# Patient Record
Sex: Male | Born: 1980 | Race: Black or African American | Hispanic: No | Marital: Single | Smoking: Former smoker
Health system: Southern US, Community
[De-identification: ages and names within clinical notes are randomized; demographics above are authoritative.]

## PROBLEM LIST (undated history)

## (undated) DIAGNOSIS — W3400XA Accidental discharge from unspecified firearms or gun, initial encounter: Secondary | ICD-10-CM

## (undated) DIAGNOSIS — C3490 Malignant neoplasm of unspecified part of unspecified bronchus or lung: Secondary | ICD-10-CM

## (undated) HISTORY — PX: ABDOMINAL SURGERY: SHX537

---

## 2019-04-11 ENCOUNTER — Other Ambulatory Visit: Payer: Self-pay

## 2019-04-11 ENCOUNTER — Encounter: Payer: Self-pay | Admitting: Emergency Medicine

## 2019-04-11 ENCOUNTER — Emergency Department
Admission: EM | Admit: 2019-04-11 | Discharge: 2019-04-11 | Disposition: A | Discharge status: Home / Self Care | Payer: Medicaid Other | Attending: Emergency Medicine | Admitting: Emergency Medicine

## 2019-04-11 DIAGNOSIS — Z202 Contact with and (suspected) exposure to infections with a predominantly sexual mode of transmission: Secondary | ICD-10-CM | POA: Insufficient documentation

## 2019-04-11 DIAGNOSIS — Z87891 Personal history of nicotine dependence: Secondary | ICD-10-CM | POA: Insufficient documentation

## 2019-04-11 HISTORY — DX: Accidental discharge from unspecified firearms or gun, initial encounter: W34.00XA

## 2019-04-11 LAB — CHLAMYDIA/NGC RT PCR (ARMC ONLY)
Chlamydia Tr: NOT DETECTED
N gonorrhoeae: NOT DETECTED

## 2019-04-11 MED ORDER — LIDOCAINE HCL (PF) 1 % IJ SOLN
5.0000 mL | Freq: Once | INTRAMUSCULAR | Status: AC
  Administered 2019-04-11: 5 mL via INTRADERMAL
  Filled 2019-04-11: qty 5

## 2019-04-11 MED ORDER — AZITHROMYCIN 500 MG PO TABS
1000.0000 mg | ORAL_TABLET | Freq: Once | ORAL | Status: AC
  Administered 2019-04-11: 1000 mg via ORAL
  Filled 2019-04-11: qty 2

## 2019-04-11 MED ORDER — METRONIDAZOLE 500 MG PO TABS
2000.0000 mg | ORAL_TABLET | Freq: Once | ORAL | Status: AC
  Administered 2019-04-11: 2000 mg via ORAL
  Filled 2019-04-11: qty 4

## 2019-04-11 MED ORDER — CEFTRIAXONE SODIUM 250 MG IJ SOLR
250.0000 mg | Freq: Once | INTRAMUSCULAR | Status: AC
  Administered 2019-04-11: 250 mg via INTRAMUSCULAR
  Filled 2019-04-11: qty 250

## 2019-04-11 MED ORDER — PENICILLIN G BENZATHINE 1200000 UNIT/2ML IM SUSP
2.4000 10*6.[IU] | Freq: Once | INTRAMUSCULAR | Status: AC
  Administered 2019-04-11: 10:00:00 2.4 10*6.[IU] via INTRAMUSCULAR
  Filled 2019-04-11: qty 4

## 2019-04-11 NOTE — Discharge Instructions (Addendum)
Follow-up at the [facility].  You have been treated empirically today for gonorrhea/chlamydia/trichomoniasis/syphilis.  If your syphilis test returns is positive you will need to have further evaluation and should follow-up with [facility].
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
## 2019-04-11 NOTE — ED Triage Notes (Signed)
Pt to ED via POV, pt states that his girlfriend was diagnosed with Syphilis, gonorrhea, and chlamydia recently and told him that he needed to come in and be treated. Pt is in NAD. Pt denies any symptoms currently.

## 2019-04-11 NOTE — ED Notes (Signed)
Patient reports he is here for gonorrhea, chlamydia, and syphilis treatment

## 2019-04-13 LAB — RPR: RPR Ser Ql: NONREACTIVE

## 2019-10-19 ENCOUNTER — Encounter: Payer: Self-pay | Admitting: Emergency Medicine

## 2019-10-19 ENCOUNTER — Other Ambulatory Visit: Payer: Self-pay

## 2019-10-19 DIAGNOSIS — Z5321 Procedure and treatment not carried out due to patient leaving prior to being seen by health care provider: Secondary | ICD-10-CM | POA: Insufficient documentation

## 2019-10-19 DIAGNOSIS — M549 Dorsalgia, unspecified: Secondary | ICD-10-CM | POA: Insufficient documentation
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
## 2019-10-20 ENCOUNTER — Emergency Department
Admission: EM | Admit: 2019-10-20 | Discharge: 2019-10-20 | Disposition: A | Discharge status: Home / Self Care | Payer: Medicaid Other | Attending: Emergency Medicine | Admitting: Emergency Medicine

## 2019-10-20 NOTE — ED Notes (Signed)
No answer when called several times from lobby 

## 2021-03-25 ENCOUNTER — Other Ambulatory Visit: Payer: Self-pay

## 2021-03-25 ENCOUNTER — Emergency Department
Admission: EM | Admit: 2021-03-25 | Discharge: 2021-03-25 | Disposition: A | Discharge status: Home / Self Care | Payer: Medicaid Other | Attending: Physician Assistant | Admitting: Physician Assistant

## 2021-03-25 ENCOUNTER — Emergency Department: Payer: Medicaid Other

## 2021-03-25 ENCOUNTER — Encounter: Payer: Self-pay | Admitting: Emergency Medicine

## 2021-03-25 DIAGNOSIS — R531 Weakness: Secondary | ICD-10-CM | POA: Diagnosis present

## 2021-03-25 DIAGNOSIS — Z85118 Personal history of other malignant neoplasm of bronchus and lung: Secondary | ICD-10-CM | POA: Diagnosis not present

## 2021-03-25 DIAGNOSIS — Z87891 Personal history of nicotine dependence: Secondary | ICD-10-CM | POA: Diagnosis not present

## 2021-03-25 DIAGNOSIS — G609 Hereditary and idiopathic neuropathy, unspecified: Secondary | ICD-10-CM | POA: Insufficient documentation

## 2021-03-25 DIAGNOSIS — G629 Polyneuropathy, unspecified: Secondary | ICD-10-CM

## 2021-03-25 DIAGNOSIS — R400 Somnolence: Secondary | ICD-10-CM | POA: Diagnosis not present

## 2021-03-25 HISTORY — DX: Malignant neoplasm of unspecified part of unspecified bronchus or lung: C34.90

## 2021-03-25 LAB — CBC WITH DIFFERENTIAL/PLATELET
Abs Immature Granulocytes: 0.02 10*3/uL (ref 0.00–0.07)
Basophils Absolute: 0.1 10*3/uL (ref 0.0–0.1)
Basophils Relative: 1 %
Eosinophils Absolute: 0.4 10*3/uL (ref 0.0–0.5)
Eosinophils Relative: 4 %
HCT: 39.7 % (ref 39.0–52.0)
Hemoglobin: 13.6 g/dL (ref 13.0–17.0)
Immature Granulocytes: 0 %
Lymphocytes Relative: 19 %
Lymphs Abs: 1.7 10*3/uL (ref 0.7–4.0)
MCH: 31.9 pg (ref 26.0–34.0)
MCHC: 34.3 g/dL (ref 30.0–36.0)
MCV: 93.2 fL (ref 80.0–100.0)
Monocytes Absolute: 0.6 10*3/uL (ref 0.1–1.0)
Monocytes Relative: 7 %
Neutro Abs: 5.9 10*3/uL (ref 1.7–7.7)
Neutrophils Relative %: 69 %
Platelets: 452 10*3/uL — ABNORMAL HIGH (ref 150–400)
RBC: 4.26 MIL/uL (ref 4.22–5.81)
RDW: 13.2 % (ref 11.5–15.5)
WBC: 8.6 10*3/uL (ref 4.0–10.5)
nRBC: 0 % (ref 0.0–0.2)

## 2021-03-25 LAB — URINALYSIS, COMPLETE (UACMP) WITH MICROSCOPIC
Bacteria, UA: NONE SEEN
Bilirubin Urine: NEGATIVE
Glucose, UA: NEGATIVE mg/dL
Hgb urine dipstick: NEGATIVE
Ketones, ur: NEGATIVE mg/dL
Leukocytes,Ua: NEGATIVE
Nitrite: NEGATIVE
Protein, ur: NEGATIVE mg/dL
Specific Gravity, Urine: 1.021 (ref 1.005–1.030)
Squamous Epithelial / HPF: NONE SEEN (ref 0–5)
pH: 6 (ref 5.0–8.0)

## 2021-03-25 LAB — BASIC METABOLIC PANEL
Anion gap: 9 (ref 5–15)
BUN: 12 mg/dL (ref 6–20)
CO2: 23 mmol/L (ref 22–32)
Calcium: 9.2 mg/dL (ref 8.9–10.3)
Chloride: 107 mmol/L (ref 98–111)
Creatinine, Ser: 0.99 mg/dL (ref 0.61–1.24)
GFR, Estimated: >60 mL/min (ref >60)
Glucose, Bld: 104 mg/dL — ABNORMAL HIGH (ref 70–99)
Potassium: 3.9 mmol/L (ref 3.5–5.1)
Sodium: 139 mmol/L (ref 135–145)

## 2021-03-25 LAB — URINE DRUG SCREEN, QUALITATIVE (ARMC ONLY)
Amphetamines, Ur Screen: NOT DETECTED
Barbiturates, Ur Screen: NOT DETECTED
Benzodiazepine, Ur Scrn: NOT DETECTED
Cannabinoid 50 Ng, Ur ~~LOC~~: NOT DETECTED
Cocaine Metabolite,Ur ~~LOC~~: POSITIVE — AB
MDMA (Ecstasy)Ur Screen: NOT DETECTED
Methadone Scn, Ur: NOT DETECTED
Opiate, Ur Screen: NOT DETECTED
Phencyclidine (PCP) Ur S: NOT DETECTED
Tricyclic, Ur Screen: NOT DETECTED

## 2021-03-25 NOTE — ED Notes (Signed)
Pt given snacks and juice ok per PA

## 2021-03-25 NOTE — ED Notes (Signed)
Pt remains unable to pee. Eating and drinking normally.

## 2021-03-25 NOTE — ED Notes (Signed)
Taken to ct  

## 2021-03-25 NOTE — Discharge Instructions (Addendum)
No acute findings for your right-sided weakness on CT scanning today.  Due to your underlying medical condition advised follow-up with neurology for definitive evaluation.  Call the neurologist listed on your discharge care instructions Monday morning and tell them you are a follow-up from the emergency room.
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
## 2021-04-12 ENCOUNTER — Ambulatory Visit (HOSPITAL_BASED_OUTPATIENT_CLINIC_OR_DEPARTMENT_OTHER)
Admission: RE | Admit: 2021-04-12 | Discharge: 2021-04-12 | Disposition: A | Discharge status: Home / Self Care | Payer: Medicaid Other | Source: Ambulatory Visit | Attending: Family Medicine | Admitting: Family Medicine

## 2021-04-12 ENCOUNTER — Ambulatory Visit (INDEPENDENT_AMBULATORY_CARE_PROVIDER_SITE_OTHER): Payer: Medicaid Other | Admitting: Family Medicine

## 2021-04-12 ENCOUNTER — Ambulatory Visit: Payer: Self-pay

## 2021-04-12 ENCOUNTER — Other Ambulatory Visit: Payer: Self-pay

## 2021-04-12 VITALS — BP 120/82 | Ht 72.0 in | Wt 215.0 lb

## 2021-04-12 DIAGNOSIS — M25421 Effusion, right elbow: Secondary | ICD-10-CM | POA: Insufficient documentation

## 2021-04-12 DIAGNOSIS — M7551 Bursitis of right shoulder: Secondary | ICD-10-CM

## 2021-04-12 DIAGNOSIS — R270 Ataxia, unspecified: Secondary | ICD-10-CM | POA: Diagnosis not present

## 2021-04-12 DIAGNOSIS — M25511 Pain in right shoulder: Secondary | ICD-10-CM
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
## 2021-04-12 NOTE — Patient Instructions (Signed)
Nice to meet you I will call with the results from today.   Please send me a message in MyChart with any questions or updates.  We will have follow up based on the results.   --Dr. Raeford Razor
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
## 2021-04-14 ENCOUNTER — Telehealth: Payer: Self-pay | Admitting: Family Medicine

## 2021-04-14 NOTE — Telephone Encounter (Signed)
Unable to leave VM for patient. If he calls back please have him speak with a nurse/CMA and inform that his x-ray is normal.  Would consider getting updated MRI of his brain or having follow-up with neurologist given his new onset symptoms over the past month..   If any questions then please take the best time and phone number to call and I will try to call him back.   Rosemarie Ax, MD [facility] 04/14/2021, 10:44 AM

## 2021-07-19 ENCOUNTER — Ambulatory Visit: Payer: Medicaid Other | Admitting: Occupational Therapy

## 2021-07-26 ENCOUNTER — Ambulatory Visit: Payer: Medicaid Other | Attending: Nurse Practitioner | Admitting: Occupational Therapy

## 2021-08-02 ENCOUNTER — Ambulatory Visit: Payer: Medicaid Other | Admitting: Physical Therapy

## 2021-08-14 ENCOUNTER — Ambulatory Visit: Payer: Medicaid Other

## 2021-08-14 ENCOUNTER — Encounter: Payer: Medicaid Other | Admitting: Occupational Therapy

## 2021-08-16 ENCOUNTER — Encounter: Payer: Medicaid Other | Admitting: Occupational Therapy

## 2021-08-21 ENCOUNTER — Encounter: Payer: Medicaid Other | Admitting: Occupational Therapy

## 2021-08-21 ENCOUNTER — Ambulatory Visit: Payer: Medicaid Other

## 2021-08-23 ENCOUNTER — Ambulatory Visit: Payer: Medicaid Other | Admitting: Physical Therapy

## 2021-08-23 ENCOUNTER — Encounter: Payer: Medicaid Other | Admitting: Occupational Therapy

## 2021-08-25 ENCOUNTER — Emergency Department
Admission: EM | Admit: 2021-08-25 | Discharge: 2021-09-02 | Disposition: E | Payer: Medicaid Other | Attending: Emergency Medicine | Admitting: Emergency Medicine

## 2021-08-25 ENCOUNTER — Emergency Department: Payer: Medicaid Other

## 2021-08-25 DIAGNOSIS — C799 Secondary malignant neoplasm of unspecified site: Secondary | ICD-10-CM

## 2021-08-25 DIAGNOSIS — Z87891 Personal history of nicotine dependence: Secondary | ICD-10-CM | POA: Diagnosis not present

## 2021-08-25 DIAGNOSIS — Z85118 Personal history of other malignant neoplasm of bronchus and lung: Secondary | ICD-10-CM | POA: Insufficient documentation

## 2021-08-25 DIAGNOSIS — I2609 Other pulmonary embolism with acute cor pulmonale: Secondary | ICD-10-CM | POA: Diagnosis not present

## 2021-08-25 DIAGNOSIS — C7931 Secondary malignant neoplasm of brain: Secondary | ICD-10-CM | POA: Insufficient documentation

## 2021-08-25 DIAGNOSIS — I469 Cardiac arrest, cause unspecified: Secondary | ICD-10-CM | POA: Diagnosis present

## 2021-08-25 LAB — COMPREHENSIVE METABOLIC PANEL
ALT: 119 U/L — ABNORMAL HIGH (ref 0–44)
AST: 73 U/L — ABNORMAL HIGH (ref 15–41)
Albumin: 2.5 g/dL — ABNORMAL LOW (ref 3.5–5.0)
Alkaline Phosphatase: 73 U/L (ref 38–126)
Anion gap: 18 — ABNORMAL HIGH (ref 5–15)
BUN: UNDETERMINED mg/dL (ref 6–20)
CO2: 15 mmol/L — ABNORMAL LOW (ref 22–32)
Calcium: 8.4 mg/dL — ABNORMAL LOW (ref 8.9–10.3)
Chloride: 106 mmol/L (ref 98–111)
Creatinine, Ser: UNDETERMINED mg/dL (ref 0.61–1.24)
Glucose, Bld: 273 mg/dL — ABNORMAL HIGH (ref 70–99)
Potassium: 5.1 mmol/L (ref 3.5–5.1)
Sodium: 139 mmol/L (ref 135–145)
Total Bilirubin: 0.8 mg/dL (ref 0.3–1.2)
Total Protein: 5.3 g/dL — ABNORMAL LOW (ref 6.5–8.1)

## 2021-08-25 LAB — BLOOD GAS, ARTERIAL
Acid-base deficit: 22.2 mmol/L — ABNORMAL HIGH (ref 0.0–2.0)
Bicarbonate: 10.9 mmol/L — ABNORMAL LOW (ref 20.0–28.0)
FIO2: 100
MECHVT: 500 mL
Mechanical Rate: 18
O2 Saturation: 90.7 %
PEEP: 5 cmH2O
Patient temperature: 37
pCO2 arterial: 57 mmHg — ABNORMAL HIGH (ref 32.0–48.0)
pH, Arterial: <6.9 — CL (ref 7.350–7.450)
pO2, Arterial: 100 mmHg (ref 83.0–108.0)

## 2021-08-25 LAB — CBC WITH DIFFERENTIAL/PLATELET
Abs Immature Granulocytes: 1.06 10*3/uL — ABNORMAL HIGH (ref 0.00–0.07)
Basophils Absolute: 0 10*3/uL (ref 0.0–0.1)
Basophils Relative: 0 %
Eosinophils Absolute: 0 10*3/uL (ref 0.0–0.5)
Eosinophils Relative: 0 %
HCT: 43.7 % (ref 39.0–52.0)
Hemoglobin: 13.7 g/dL (ref 13.0–17.0)
Immature Granulocytes: 8 %
Lymphocytes Relative: 24 %
Lymphs Abs: 3 10*3/uL (ref 0.7–4.0)
MCH: 33.7 pg (ref 26.0–34.0)
MCHC: 31.4 g/dL (ref 30.0–36.0)
MCV: 107.4 fL — ABNORMAL HIGH (ref 80.0–100.0)
Monocytes Absolute: 0.5 10*3/uL (ref 0.1–1.0)
Monocytes Relative: 4 %
Neutro Abs: 8.1 10*3/uL — ABNORMAL HIGH (ref 1.7–7.7)
Neutrophils Relative %: 64 %
Platelets: 102 10*3/uL — ABNORMAL LOW (ref 150–400)
RBC: 4.07 MIL/uL — ABNORMAL LOW (ref 4.22–5.81)
RDW: 15.9 % — ABNORMAL HIGH (ref 11.5–15.5)
Smear Review: NORMAL
WBC: 12.7 10*3/uL — ABNORMAL HIGH (ref 4.0–10.5)
nRBC: 1.5 % — ABNORMAL HIGH (ref 0.0–0.2)

## 2021-08-25 LAB — CREATININE, SERUM
Creatinine, Ser: 1.02 mg/dL (ref 0.61–1.24)
GFR, Estimated: >60 mL/min (ref >60)

## 2021-08-25 LAB — BUN: BUN: 19 mg/dL (ref 6–20)

## 2021-08-25 LAB — TROPONIN I (HIGH SENSITIVITY): Troponin I (High Sensitivity): 163 ng/L (ref <18)

## 2021-08-25 MED ORDER — EPINEPHRINE PF 1 MG/ML IJ SOLN
1.0000 mg | Freq: Once | INTRAMUSCULAR | Status: AC
  Administered 2021-08-25: 1 mg via INTRAVENOUS

## 2021-08-25 MED ORDER — LEVETIRACETAM IN NACL 1000 MG/100ML IV SOLN
1000.0000 mg | Freq: Once | INTRAVENOUS | Status: AC
  Administered 2021-08-25: 1000 mg via INTRAVENOUS
  Filled 2021-08-25: qty 100

## 2021-08-25 MED ORDER — IOHEXOL 350 MG/ML SOLN
75.0000 mL | Freq: Once | INTRAVENOUS | Status: AC | PRN
  Administered 2021-08-25: 75 mL via INTRAVENOUS

## 2021-08-25 MED ORDER — SODIUM CHLORIDE 3 % IV BOLUS
250.0000 mL | Freq: Once | INTRAVENOUS | Status: AC
  Administered 2021-08-25: 250 mL via INTRAVENOUS
  Filled 2021-08-25: qty 500

## 2021-08-25 MED ORDER — EPINEPHRINE PF 1 MG/ML IJ SOLN
INTRAMUSCULAR | Status: AC
  Administered 2021-08-25: 1 mg via INTRAVENOUS
  Filled 2021-08-25: qty 1

## 2021-08-25 MED ORDER — DEXMEDETOMIDINE HCL IN NACL 400 MCG/100ML IV SOLN
0.0000 ug/kg/h | INTRAVENOUS | Status: DC
  Administered 2021-08-25: 0.4 ug/kg/h via INTRAVENOUS
  Filled 2021-08-25: qty 100

## 2021-08-25 MED ORDER — EPINEPHRINE 1 MG/10ML IJ SOSY
PREFILLED_SYRINGE | INTRAMUSCULAR | Status: AC | PRN
  Administered 2021-08-25 (×2): 1 mg via INTRAVENOUS

## 2021-08-25 MED ORDER — FENTANYL CITRATE PF 50 MCG/ML IJ SOSY
100.0000 ug | PREFILLED_SYRINGE | INTRAMUSCULAR | Status: DC | PRN
  Filled 2021-08-25: qty 2

## 2021-08-25 MED ORDER — FENTANYL CITRATE PF 50 MCG/ML IJ SOSY
100.0000 ug | PREFILLED_SYRINGE | INTRAMUSCULAR | Status: DC | PRN
Start: 2021-08-25 — End: 2021-08-26
  Administered 2021-08-25: 100 ug via INTRAVENOUS

## 2021-08-25 MED ORDER — SODIUM BICARBONATE 8.4 % IV SOLN
50.0000 meq | Freq: Once | INTRAVENOUS | Status: AC
  Administered 2021-08-25: 50 meq via INTRAVENOUS

## 2021-08-25 MED ORDER — MORPHINE SULFATE (PF) 2 MG/ML IV SOLN
2.0000 mg | Freq: Once | INTRAVENOUS | Status: AC
  Administered 2021-08-25: 2 mg via INTRAVENOUS
  Filled 2021-08-25: qty 1

## 2021-08-25 MED ORDER — EPINEPHRINE 1 MG/10ML IJ SOSY
PREFILLED_SYRINGE | INTRAMUSCULAR | Status: AC
  Filled 2021-08-25: qty 50

## 2021-08-25 MED ORDER — EPINEPHRINE PF 1 MG/ML IJ SOLN: 1.0000 mg | Freq: Once | INTRAMUSCULAR | Status: AC

## 2021-08-25 MED ORDER — NOREPINEPHRINE 4 MG/250ML-% IV SOLN
0.0000 ug/min | INTRAVENOUS | Status: DC
Start: 2021-08-25 — End: 2021-08-26
  Administered 2021-08-25: 10 ug/min via INTRAVENOUS
  Filled 2021-08-25: qty 250

## 2021-08-25 MED ORDER — DEXMEDETOMIDINE HCL IN NACL 400 MCG/100ML IV SOLN: 0.0000 ug/kg/h | INTRAVENOUS | Status: DC

## 2021-08-25 MED ORDER — MIDAZOLAM HCL 2 MG/2ML IJ SOLN
2.0000 mg | INTRAMUSCULAR | Status: DC | PRN
Start: 2021-08-25 — End: 2021-08-26
  Administered 2021-08-25 (×2): 2 mg via INTRAVENOUS
  Filled 2021-08-25 (×2): qty 2

## 2021-08-25 MED ORDER — DEXAMETHASONE SODIUM PHOSPHATE 10 MG/ML IJ SOLN
10.0000 mg | Freq: Once | INTRAMUSCULAR | Status: AC
  Administered 2021-08-25: 10 mg via INTRAVENOUS
  Filled 2021-08-25: qty 1

## 2021-08-25 MED ORDER — MIDAZOLAM HCL 2 MG/2ML IJ SOLN: 2.0000 mg | INTRAMUSCULAR | Status: DC | PRN

## 2021-08-25 NOTE — ED Notes (Signed)
Family arrived with Cha[place]in to room.

## 2021-08-25 NOTE — ED Notes (Signed)
MD discussed with pt wife and sister about condition. Family agreed to extubation. RT at bedside and extubated.  Medication given for comfort. Monitor placed on comfort mode in room.  Chaplain in room.  Pt face cleansed with washrag.

## 2021-08-25 NOTE — ED Notes (Signed)
CPR paused; Pulse check - carotid pulse felt.

## 2021-08-25 NOTE — ED Notes (Signed)
Linton Rump removed per MD Charna Archer.

## 2021-08-25 NOTE — ED Notes (Signed)
Patient's became bradycardic. Pulses lost at 1708. CPR started. MD Charna Archer and Paduchowski at bedside.

## 2021-08-25 NOTE — ED Notes (Signed)
Pulses lost. CPR resumed.

## 2021-08-25 NOTE — ED Notes (Signed)
Md in with pt family.

## 2021-08-25 NOTE — Progress Notes (Signed)
Patient extubated to room air.
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
## 2021-08-25 NOTE — ED Notes (Signed)
CPR paused; pulse check. Pulse felt.

## 2021-08-25 NOTE — ED Notes (Signed)
CPR paused; pulse check. Pulse felt, then immediately lost. CPR resumed.

## 2021-08-25 NOTE — ED Notes (Signed)
Pulse check, carotid pulse felt.

## 2021-08-25 NOTE — ED Notes (Signed)
CPR paused. Pulse check. Weak pulse felt.

## 2021-08-25 NOTE — ED Notes (Signed)
Patient with pauses in rhythm, becoming bradycardic. Faint pulse felt.

## 2021-08-25 NOTE — ED Notes (Signed)
CPR paused. Pulse check. Carotid pulse felt.

## 2021-08-25 NOTE — ED Notes (Signed)
Cardiac activity seen on ultrasound.

## 2021-08-25 NOTE — Progress Notes (Signed)
Prayer of comfort and peace for patient, spiritual emotion support for spouse and children.
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
## 2021-08-25 NOTE — ED Notes (Signed)
Pulse check; carotid and femoral pulse felt.

## 2021-08-25 NOTE — ED Notes (Signed)
See Code Narrator for charting of code.

## 2021-08-25 NOTE — ED Notes (Signed)
King's airway removed by MD Jessup at 16:46. MD placed ET tube, 8.0, 24 at the lip at 16:47.

## 2021-08-25 NOTE — ED Notes (Signed)
Pulse check; femoral and carotid pulse felt.
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
## 2021-08-25 NOTE — ED Notes (Signed)
Vent alarmed 'check circuit'. Circuit appears intact. Respiratory paged to room to confirm.

## 2021-08-25 NOTE — ED Notes (Signed)
Pulse becoming weaker

## 2021-08-25 NOTE — ED Notes (Signed)
RT at bedside.

## 2021-08-25 NOTE — ED Notes (Signed)
Kyle Beard restarted.

## 2021-08-25 NOTE — ED Notes (Signed)
Patient becoming bradycardic, HR at 42. Levophed started at 10.

## 2021-08-25 NOTE — ED Notes (Signed)
Pulses lost. CPR restarted

## 2021-08-25 NOTE — ED Notes (Signed)
Heart rate slowing. Pulse check. No pulse felt. CPR resumed.

## 2021-08-27 MED FILL — Medication: Qty: 1 | Status: AC

## 2021-08-29 ENCOUNTER — Encounter: Payer: Medicaid Other | Admitting: Occupational Therapy

## 2021-08-29 ENCOUNTER — Ambulatory Visit: Payer: Medicaid Other | Admitting: Physical Therapy

## 2021-08-31 ENCOUNTER — Ambulatory Visit: Payer: Medicaid Other | Admitting: Physical Therapy

## 2021-09-02 DEATH — deceased

## 2021-09-04 ENCOUNTER — Encounter: Payer: Medicaid Other | Admitting: Occupational Therapy

## 2021-09-04 ENCOUNTER — Ambulatory Visit: Payer: Medicaid Other

## 2021-09-06 ENCOUNTER — Ambulatory Visit: Payer: Medicaid Other | Admitting: Physical Therapy

## 2021-09-06 ENCOUNTER — Encounter: Payer: Medicaid Other | Admitting: Occupational Therapy

## 2021-09-11 ENCOUNTER — Ambulatory Visit: Payer: Medicaid Other

## 2021-09-11 ENCOUNTER — Encounter: Payer: Medicaid Other | Admitting: Occupational Therapy

## 2021-09-13 ENCOUNTER — Encounter: Payer: Medicaid Other | Admitting: Occupational Therapy

## 2021-09-13 ENCOUNTER — Ambulatory Visit: Payer: Medicaid Other

## 2021-09-18 ENCOUNTER — Ambulatory Visit: Payer: Medicaid Other

## 2021-09-18 ENCOUNTER — Encounter: Payer: Medicaid Other | Admitting: Occupational Therapy

## 2021-09-20 ENCOUNTER — Ambulatory Visit: Payer: Medicaid Other | Admitting: Physical Therapy

## 2021-09-20 ENCOUNTER — Encounter: Payer: Medicaid Other | Admitting: Occupational Therapy

## 2021-09-25 ENCOUNTER — Encounter: Payer: Medicaid Other | Admitting: Occupational Therapy

## 2021-09-25 ENCOUNTER — Ambulatory Visit: Payer: Medicaid Other

## 2021-09-27 ENCOUNTER — Ambulatory Visit: Payer: Medicaid Other | Admitting: Physical Therapy

## 2021-09-27 ENCOUNTER — Encounter: Payer: Medicaid Other | Admitting: Occupational Therapy

## 2021-10-02 ENCOUNTER — Encounter: Payer: Medicaid Other | Admitting: Occupational Therapy

## 2021-10-02 ENCOUNTER — Ambulatory Visit: Payer: Medicaid Other

## 2021-10-04 ENCOUNTER — Encounter: Payer: Medicaid Other | Admitting: Occupational Therapy

## 2021-10-04 ENCOUNTER — Ambulatory Visit: Payer: Medicaid Other

## 2021-10-09 ENCOUNTER — Encounter: Payer: Medicaid Other | Admitting: Occupational Therapy

## 2021-10-09 ENCOUNTER — Ambulatory Visit: Payer: Medicaid Other

## 2021-10-16 ENCOUNTER — Encounter: Payer: Medicaid Other | Admitting: Occupational Therapy

## 2021-10-16 ENCOUNTER — Ambulatory Visit: Payer: Medicaid Other
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
## 2021-10-23 ENCOUNTER — Encounter: Payer: Medicaid Other | Admitting: Occupational Therapy

## 2021-10-23 ENCOUNTER — Ambulatory Visit: Payer: Medicaid Other

## 2021-10-30 ENCOUNTER — Ambulatory Visit: Payer: Medicaid Other

## 2021-10-30 ENCOUNTER — Encounter: Payer: Medicaid Other | Admitting: Occupational Therapy

## 2021-11-06 ENCOUNTER — Encounter: Payer: Medicaid Other | Admitting: Occupational Therapy

## 2021-11-06 ENCOUNTER — Ambulatory Visit: Payer: Medicaid Other
